# Patient Record
Sex: Female | Born: 1955 | Race: Black or African American | Hispanic: No | Smoking: Never smoker
Health system: Southern US, Community
[De-identification: ages and names within clinical notes are randomized; demographics above are authoritative.]

---

## 2004-11-07 ENCOUNTER — Emergency Department: Payer: Self-pay | Admitting: Emergency Medicine

## 2006-12-03 ENCOUNTER — Emergency Department: Payer: Self-pay | Admitting: Emergency Medicine

## 2008-01-10 ENCOUNTER — Emergency Department: Payer: Self-pay | Admitting: Emergency Medicine

## 2009-09-23 ENCOUNTER — Emergency Department: Payer: Self-pay | Admitting: Internal Medicine

## 2014-12-18 ENCOUNTER — Emergency Department: Payer: Self-pay | Admitting: Emergency Medicine

## 2016-11-05 ENCOUNTER — Emergency Department: Payer: BLUE CROSS/BLUE SHIELD

## 2016-11-05 ENCOUNTER — Emergency Department
Admission: EM | Admit: 2016-11-05 | Discharge: 2016-11-05 | Disposition: A | Discharge status: Home / Self Care | Payer: BLUE CROSS/BLUE SHIELD | Attending: Emergency Medicine | Admitting: Emergency Medicine

## 2016-11-05 ENCOUNTER — Encounter: Payer: Self-pay | Admitting: Emergency Medicine

## 2016-11-05 DIAGNOSIS — R1013 Epigastric pain: Secondary | ICD-10-CM | POA: Diagnosis not present

## 2016-11-05 DIAGNOSIS — R197 Diarrhea, unspecified: Secondary | ICD-10-CM | POA: Insufficient documentation

## 2016-11-05 DIAGNOSIS — R112 Nausea with vomiting, unspecified: Secondary | ICD-10-CM | POA: Insufficient documentation

## 2016-11-05 LAB — COMPREHENSIVE METABOLIC PANEL
ALBUMIN: 4.3 g/dL (ref 3.5–5.0)
ALK PHOS: 75 U/L (ref 38–126)
ALT: 22 U/L (ref 14–54)
AST: 27 U/L (ref 15–41)
Anion gap: 7 (ref 5–15)
BUN: 15 mg/dL (ref 6–20)
CALCIUM: 8.6 mg/dL — AB (ref 8.9–10.3)
CHLORIDE: 106 mmol/L (ref 101–111)
CO2: 24 mmol/L (ref 22–32)
CREATININE: 0.64 mg/dL (ref 0.44–1.00)
GFR calc non Af Amer: >60 mL/min (ref >60)
GLUCOSE: 126 mg/dL — AB (ref 65–99)
Potassium: 3.4 mmol/L — ABNORMAL LOW (ref 3.5–5.1)
SODIUM: 137 mmol/L (ref 135–145)
Total Bilirubin: 0.7 mg/dL (ref 0.3–1.2)
Total Protein: 8.2 g/dL — ABNORMAL HIGH (ref 6.5–8.1)

## 2016-11-05 LAB — URINALYSIS, COMPLETE (UACMP) WITH MICROSCOPIC
Bacteria, UA: NONE SEEN
Bilirubin Urine: NEGATIVE
GLUCOSE, UA: NEGATIVE mg/dL
Hgb urine dipstick: NEGATIVE
KETONES UR: NEGATIVE mg/dL
Leukocytes, UA: NEGATIVE
Nitrite: NEGATIVE
PH: 7 (ref 5.0–8.0)
Protein, ur: NEGATIVE mg/dL
Specific Gravity, Urine: 1.019 (ref 1.005–1.030)

## 2016-11-05 LAB — CBC
HCT: 38.4 % (ref 35.0–47.0)
Hemoglobin: 13.3 g/dL (ref 12.0–16.0)
MCH: 29.5 pg (ref 26.0–34.0)
MCHC: 34.7 g/dL (ref 32.0–36.0)
MCV: 84.8 fL (ref 80.0–100.0)
PLATELETS: 218 10*3/uL (ref 150–440)
RBC: 4.53 MIL/uL (ref 3.80–5.20)
RDW: 12.9 % (ref 11.5–14.5)
WBC: 8.6 10*3/uL (ref 3.6–11.0)

## 2016-11-05 LAB — TROPONIN I: Troponin I: <0.03 ng/mL (ref <0.03)

## 2016-11-05 LAB — LIPASE, BLOOD: LIPASE: 25 U/L (ref 11–51)

## 2016-11-05 MED ORDER — SODIUM CHLORIDE 0.9 % IV BOLUS (SEPSIS)
1000.0000 mL | Freq: Once | INTRAVENOUS | Status: AC
  Administered 2016-11-05: 1000 mL via INTRAVENOUS

## 2016-11-05 MED ORDER — GI COCKTAIL ~~LOC~~
30.0000 mL | Freq: Once | ORAL | Status: AC
  Administered 2016-11-05: 30 mL via ORAL
  Filled 2016-11-05: qty 30

## 2016-11-05 MED ORDER — ONDANSETRON 4 MG PO TBDP
4.0000 mg | ORAL_TABLET | Freq: Once | ORAL | Status: AC | PRN
  Administered 2016-11-05: 4 mg via ORAL
  Filled 2016-11-05: qty 1

## 2016-11-05 MED ORDER — ONDANSETRON HCL 4 MG PO TABS: 4.0000 mg | ORAL_TABLET | Freq: Every day | ORAL | 1 refills | Status: AC | PRN

## 2016-11-05 MED ORDER — OMEPRAZOLE 40 MG PO CPDR: 40.0000 mg | DELAYED_RELEASE_CAPSULE | Freq: Every day | ORAL | 0 refills | Status: AC

## 2016-11-05 MED ORDER — ONDANSETRON HCL 4 MG/2ML IJ SOLN
4.0000 mg | Freq: Once | INTRAMUSCULAR | Status: AC
  Administered 2016-11-05: 4 mg via INTRAVENOUS
  Filled 2016-11-05: qty 2

## 2016-11-05 MED ORDER — IOPAMIDOL (ISOVUE-300) INJECTION 61%
100.0000 mL | Freq: Once | INTRAVENOUS | Status: AC | PRN
  Administered 2016-11-05: 100 mL via INTRAVENOUS

## 2016-11-05 MED ORDER — IOPAMIDOL (ISOVUE-300) INJECTION 61%
30.0000 mL | Freq: Once | INTRAVENOUS | Status: AC
  Administered 2016-11-05: 30 mL via ORAL

## 2016-11-05 NOTE — ED Triage Notes (Signed)
Pt ambulatory to triage with steady gait, no distress noted. Pt c/o N/V/D and upper abdominal pain since 2300 last night. Pt denies fever or chest pain.

## 2016-11-05 NOTE — ED Notes (Signed)
Pt ambulated to the bathroom. Pt returned to her bed without difficulty.

## 2016-11-05 NOTE — ED Provider Notes (Signed)
[facility] Emergency Department Provider Note   First MD Initiated Contact with Patient 11/05/16 [phone]     (approximate)  I have reviewed the triage vital signs and the nursing notes.   HISTORY  Chief Complaint Abdominal Pain    HPI Carmen Cervantes is a 61 y.o. female presents with acute onset of nausea, nonbloody vomiting diarrhea and upper abdominal pain with onset 11:00 last night. Patient denies any fever afebrile on presentation temperature 98.3. Patient describes the pain is burning current pain score 7 out of 10.   Past medical history None There are no active problems to display for this patient.   Past surgical history None  Prior to Admission medications   Medication Sig Start Date End Date Taking? Authorizing Provider  omeprazole (PRILOSEC) 40 MG capsule Take 1 capsule (40 mg total) by mouth daily. 11/05/16 12/05/16  Darci Current, MD  ondansetron (ZOFRAN) 4 MG tablet Take 1 tablet (4 mg total) by mouth daily as needed for nausea or vomiting. 11/05/16 11/05/17  Darci Current, MD    Allergies Patient has no known allergies.  History reviewed. No pertinent family history.  Social History Social History  Substance Use Topics  . Smoking status: Never Smoker  . Smokeless tobacco: Never Used  . Alcohol use Yes    Review of Systems Constitutional: No fever/chills Eyes: No visual changes. ENT: No sore throat. Cardiovascular: Denies chest pain. Respiratory: Denies shortness of breath. Gastrointestinal:Positive for abdominal pain and vomiting and diarrhea  Genitourinary: Negative for dysuria. Musculoskeletal: Negative for back pain. Skin: Negative for rash. Neurological: Negative for headaches, focal weakness or numbness.  10-point ROS otherwise negative.  ____________________________________________   PHYSICAL EXAM:  VITAL SIGNS: ED Triage Vitals [11/05/16 0103]  Enc Vitals Group     BP (!) 149/90     Pulse Rate 88   Resp 15     Temp 98.3 F (36.8 C)     Temp Source Oral     SpO2 100 %     Weight 150 lb (68 kg)     Height 5\' 5"  (1.651 m)     Head Circumference      Peak Flow      Pain Score      Pain Loc      Pain Edu?      Excl. in GC?     Constitutional: Alert and oriented. Well appearing and in no acute distress. Eyes: Conjunctivae are normal. PERRL. EOMI. Head: Atraumatic. Mouth/Throat: Mucous membranes are moist.  Oropharynx non-erythematous. Neck: No stridor.   Cardiovascular: Normal rate, regular rhythm. Good peripheral circulation. Grossly normal heart sounds. Respiratory: Normal respiratory effort.  No retractions. Lungs CTAB. Gastrointestinal: Epigastric/umbilical pain with palpation. No distention.  Musculoskeletal: No lower extremity tenderness nor edema. No gross deformities of extremities. Neurologic:  Normal speech and language. No gross focal neurologic deficits are appreciated.  Skin:  Skin is warm, dry and intact. No rash noted. Psychiatric: Mood and affect are normal. Speech and behavior are normal.  ____________________________________________   LABS (all labs ordered are listed, but only abnormal results are displayed)  Labs Reviewed  COMPREHENSIVE METABOLIC PANEL - Abnormal; Notable for the following:       Result Value   Potassium 3.4 (*)    Glucose, Bld 126 (*)    Calcium 8.6 (*)    Total Protein 8.2 (*)    All other components within normal limits  URINALYSIS, COMPLETE (UACMP) WITH MICROSCOPIC - Abnormal; Notable for the  following:    Color, Urine YELLOW (*)    APPearance CLEAR (*)    Squamous Epithelial / LPF 0-5 (*)    All other components within normal limits  LIPASE, BLOOD  CBC  TROPONIN I   ____________________________________________  EKG  ED ECG REPORT I, Bernice N Aunesty Tyson, the attending physician, personally viewed and interpreted this ECG.   Date: 11/05/2016  EKG Time: 1:08 AM  Rate: 86  Rhythm: Normal sinus rhythm  Axis: Normal   Intervals: Normal  ST&T Change: None _______  RADIOLOGY I, Kress N Almadelia Looman, personally viewed and evaluated these images (plain radiographs) as part of my medical decision making, as well as reviewing the written report by the radiologist.  CLINICAL DATA:  Epigastric and mid abdominal pain.  Vomiting.  EXAM: CT ABDOMEN AND PELVIS WITH CONTRAST  TECHNIQUE: Multidetector CT imaging of the abdomen and pelvis was performed using the standard protocol following bolus administration of intravenous contrast.  CONTRAST:  100mL ISOVUE-300 IOPAMIDOL (ISOVUE-300) INJECTION 61%  COMPARISON:  None.  FINDINGS: Lower chest: Lung bases are clear. No consolidation or pleural fluid. Patulous distal esophagus.  Hepatobiliary: No focal liver abnormality is seen. No gallstones, gallbladder wall thickening, or biliary dilatation.  Pancreas: No ductal dilatation or inflammation.  Spleen: Normal in size without focal abnormality.  Adrenals/Urinary Tract: No adrenal nodule. No hydronephrosis or perinephric edema. Symmetric renal enhancement and excretion on delayed phase imaging. Partially duplicated left renal collecting system. 7 mm cortical hypodensity in the medial mid left kidney is too small to accurately characterize. Urinary bladder is physiologically distended, no bladder wall thickening.  Stomach/Bowel: Stomach is within normal limits. Appendix is faintly visualized and appears normal. No evidence of bowel wall thickening, distention, or inflammatory changes. Enteric contrast is seen throughout the colon.  Vascular/Lymphatic: No significant vascular findings are present. No enlarged abdominal or pelvic lymph nodes.  Reproductive: Uterus and bilateral adnexa are unremarkable. Mild dilatation of the left ovarian vein measuring 8 mm, however no increased adnexal or periuterine vascularity.  Other: Small fat containing umbilical hernia. No free air, free fluid, or  intra-abdominal fluid collection.  Musculoskeletal: There are no acute or suspicious osseous abnormalities. 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  IMPRESSION: 1. No explanation for abdominal pain and vomiting. No acute abnormality. 2. Small fat containing umbilical hernia.   Electronically Signed   By: Rubye OaksMelanie  Ehinger M.D.   On: 11/05/2016 06:37 Procedures     INITIAL IMPRESSION / ASSESSMENT AND PLAN / ED COURSE  Pertinent labs & imaging results that were available during my care of the patient were reviewed by me and considered in my medical decision making (see chart for details).        ____________________________________________  FINAL CLINICAL IMPRESSION(S) / ED DIAGNOSES  Final diagnoses:  Nausea vomiting and diarrhea     MEDICATIONS GIVEN DURING THIS VISIT:  Medications  ondansetron (ZOFRAN-ODT) disintegrating tablet 4 mg (4 mg Oral Given 11/05/16 0109)  ondansetron (ZOFRAN) injection 4 mg (4 mg Intravenous Given 11/05/16 0458)  sodium chloride 0.9 % bolus 1,000 mL (0 mLs Intravenous Stopped 11/05/16 0558)  gi cocktail (Maalox,Lidocaine,Donnatal) (30 mLs Oral Given 11/05/16 0459)  iopamidol (ISOVUE-300) 61 % injection 30 mL (30 mLs Oral Contrast Given 11/05/16 0506)  iopamidol (ISOVUE-300) 61 % injection 100 mL (100 mLs Intravenous Contrast Given 11/05/16 0615)     NEW OUTPATIENT MEDICATIONS STARTED DURING THIS VISIT:  New Prescriptions   OMEPRAZOLE (PRILOSEC) 40 MG CAPSULE    Take 1 capsule (40  mg total) by mouth daily.   ONDANSETRON (ZOFRAN) 4 MG TABLET    Take 1 tablet (4 mg total) by mouth daily as needed for nausea or vomiting.    Modified Medications   No medications on file    Discontinued Medications   No medications on file     Note:  This document was prepared using Dragon voice recognition software and may include unintentional dictation errors.    Darci Current, MD 11/05/16 [phone]

## 2016-11-05 NOTE — ED Notes (Signed)
Pt went to CT

## 2019-09-04 ENCOUNTER — Emergency Department: Payer: BC Managed Care – PPO

## 2019-09-04 ENCOUNTER — Emergency Department
Admission: EM | Admit: 2019-09-04 | Discharge: 2019-09-04 | Disposition: A | Discharge status: Home / Self Care | Payer: BC Managed Care – PPO | Attending: Student | Admitting: Student

## 2019-09-04 ENCOUNTER — Other Ambulatory Visit: Payer: Self-pay

## 2019-09-04 ENCOUNTER — Encounter: Payer: Self-pay | Admitting: Intensive Care

## 2019-09-04 DIAGNOSIS — M25561 Pain in right knee: Secondary | ICD-10-CM

## 2019-09-04 MED ORDER — IBUPROFEN 800 MG PO TABS
800.0000 mg | ORAL_TABLET | Freq: Once | ORAL | Status: AC
  Administered 2019-09-04: 18:00:00 800 mg via ORAL
  Filled 2019-09-04: qty 1

## 2019-09-04 MED ORDER — PREDNISONE 10 MG PO TABS: ORAL_TABLET | ORAL | 0 refills | Status: AC

## 2019-09-04 NOTE — ED Notes (Signed)
Pt given ice pack for right knee.

## 2019-09-04 NOTE — ED Notes (Signed)
Pt to the er for right knee pain that started on Wednesday. Pt denies any hx of arthritis but it does run in the mail. Pain is on the anterior that is distal to the knee cap and runs medial side to the kneecap. Pt has it wrapped.

## 2019-09-04 NOTE — ED Provider Notes (Signed)
[facility] Emergency Department Provider Note ____________________________________________  Time seen: 1750 I have reviewed the triage vital signs and the nursing notes.  HISTORY  Chief Complaint  Knee Pain (right)   HPI Carmen Cervantes is a 63 y.o. female presents to the ER today with complaint of right knee pain.  She reports this started 2 days ago.  She describes the pain as sore and achy.  The pain is worse with ambulation.  She has not noticed any swelling or bruising.  She denies numbness, tingling or weakness of the right lower extremity.  She denies any injury to the area.  She has tried Aleve OTC with some relief.  History reviewed. No pertinent past medical history.  There are no active problems to display for this patient.   History reviewed. No pertinent surgical history.  Prior to Admission medications   Medication Sig Start Date End Date Taking? Authorizing Provider  omeprazole (PRILOSEC) 40 MG capsule Take 1 capsule (40 mg total) by mouth daily. 11/05/16 12/05/16  Darci Current, MD  predniSONE (DELTASONE) 10 MG tablet Take 6 tabs day 1, 5 tabs day 2, 4 tabs day 3, 3 tabs day 4, 2 tabs day 5, 1 tab day 6 09/04/19   Lorre Munroe, NP    Allergies Penicillins  History reviewed. No pertinent family history.  Social History Social History   Tobacco Use  . Smoking status: Never Smoker  . Smokeless tobacco: Never Used  Substance Use Topics  . Alcohol use: Yes  . Drug use: Never    Review of Systems  Constitutional: Negative for fever, chills or body aches. Cardiovascular: Negative for chest tightness or chest pain. Respiratory: Negative for cough or shortness of breath. Musculoskeletal: Positive for right knee pain.  Negative for hip or back pain. Skin: Negative for redness, swelling or warmth. Neurological: Negative for focal weakness, tingling or numbness. ____________________________________________  PHYSICAL EXAM:  VITAL  SIGNS: ED Triage Vitals  Enc Vitals Group     BP 09/04/19 1629 (!) 178/93     Pulse Rate 09/04/19 1629 91     Resp 09/04/19 1629 16     Temp 09/04/19 1624 99.2 F (37.3 C)     Temp Source 09/04/19 1624 Oral     SpO2 09/04/19 1629 97 %     Weight 09/04/19 1624 163 lb (73.9 kg)     Height 09/04/19 1624 5' 5.5" (1.664 m)     Head Circumference --      Peak Flow --      Pain Score 09/04/19 1624 7     Pain Loc --      Pain Edu? --      Excl. in GC? --     Constitutional: Alert and oriented. Well appearing and in no distress. Cardiovascular: Normal rate, regular rhythm.  Pedal pulses 2+ bilaterally Respiratory: Normal respiratory effort. No wheezes/rales/rhonchi. Musculoskeletal: Normal flexion, extension of the right knee.  No joint swelling noted.  Pain with palpation over the medial pes bursa.  Negative Lachman's and McMurray.  Strength 5/5 BLE. Neurologic:  Normal speech and language. No gross focal neurologic deficits are appreciated. Skin:  Skin is warm, dry and intact. No redness, warmth or swelling noted. ____________________________________________   RADIOLOGY   Imaging Orders     DG Knee Complete 4 Views Right IMPRESSION:  Possible trace joint fluid. No acute osseous finding or degenerative  joint narrowing.    ____________________________________________   INITIAL IMPRESSION / ASSESSMENT AND PLAN /  ED COURSE  Acute Right Knee Pain:  Xray right knee- results above Ibuprofen 800 mg PO x 1 Likely pes bursitis RX for Pred Taper x 6 days Ice for 10 minutes 2 x day ____________________________________________  FINAL CLINICAL IMPRESSION(S) / ED DIAGNOSES  Final diagnoses:  Acute pain of right knee   Webb Silversmith, NP     Jearld Fenton, NP 09/04/19 1843    Lilia Pro., MD 09/05/19 1053

## 2019-09-04 NOTE — ED Triage Notes (Signed)
Patient c/o right knee pain. Denies injury ?

## 2019-09-04 NOTE — Discharge Instructions (Signed)
You were seen today for acute right knee pain.  Your x-ray was negative for any acute findings.  You likely have a pes bursitis.  I have provided you with a prescription for prednisone to take for the next 6 days.  Avoid other NSAIDs OTC such as Aleve, Ibuprofen and Motrin. You can apply ice for 10 minutes 2 x day. Follow up if pain persist or worsen.

## 2019-09-07 ENCOUNTER — Other Ambulatory Visit: Payer: Self-pay

## 2019-09-07 DIAGNOSIS — Z20822 Contact with and (suspected) exposure to covid-19: Secondary | ICD-10-CM

## 2019-09-09 LAB — NOVEL CORONAVIRUS, NAA: SARS-CoV-2, NAA: NOT DETECTED

## 2021-08-19 IMAGING — DX DG KNEE COMPLETE 4+V*R*
4 series · 4 of 4 positions shown · non-contrast
Comparison: None.

CLINICAL DATA: Atraumatic right knee pain for 2 days

EXAM:
RIGHT KNEE - COMPLETE 4+ VIEW

[knee obl (1 of 2)]
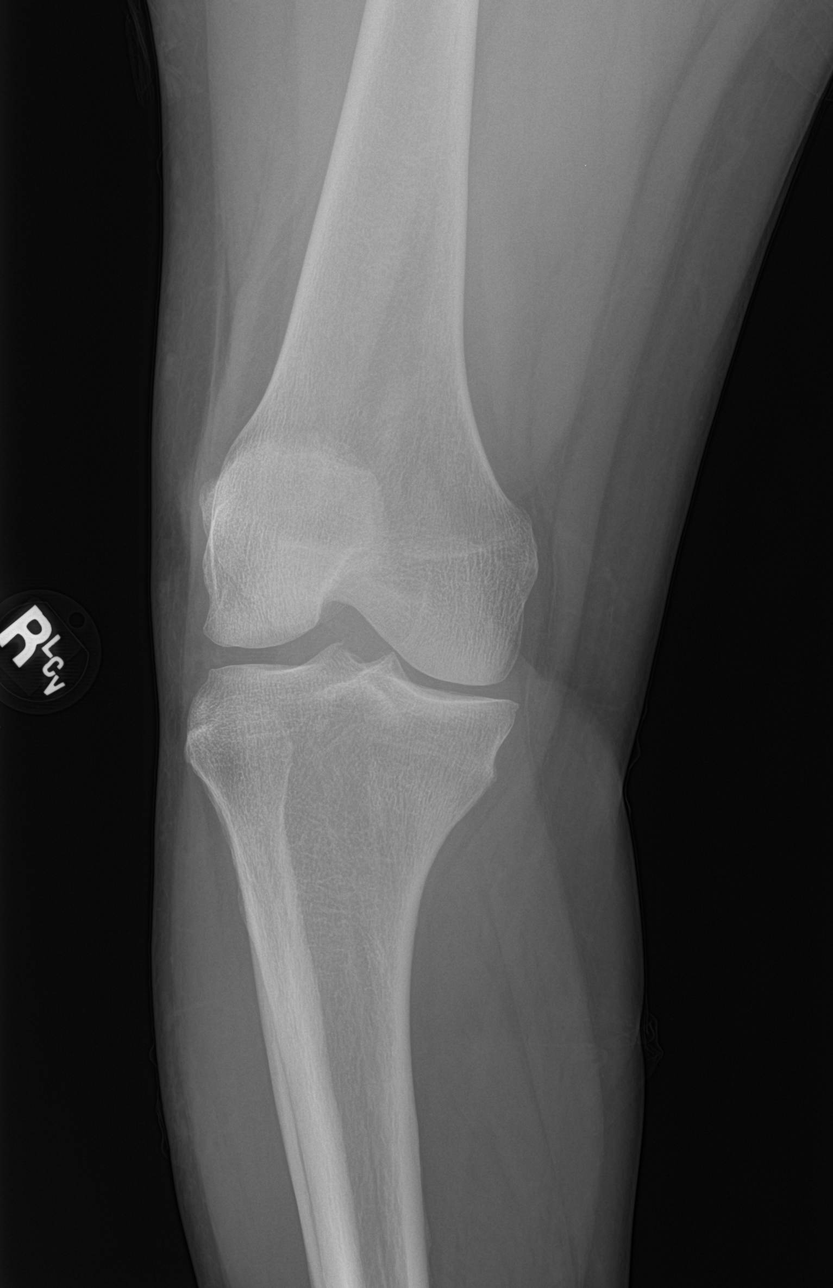

[knee obl (2 of 2)]
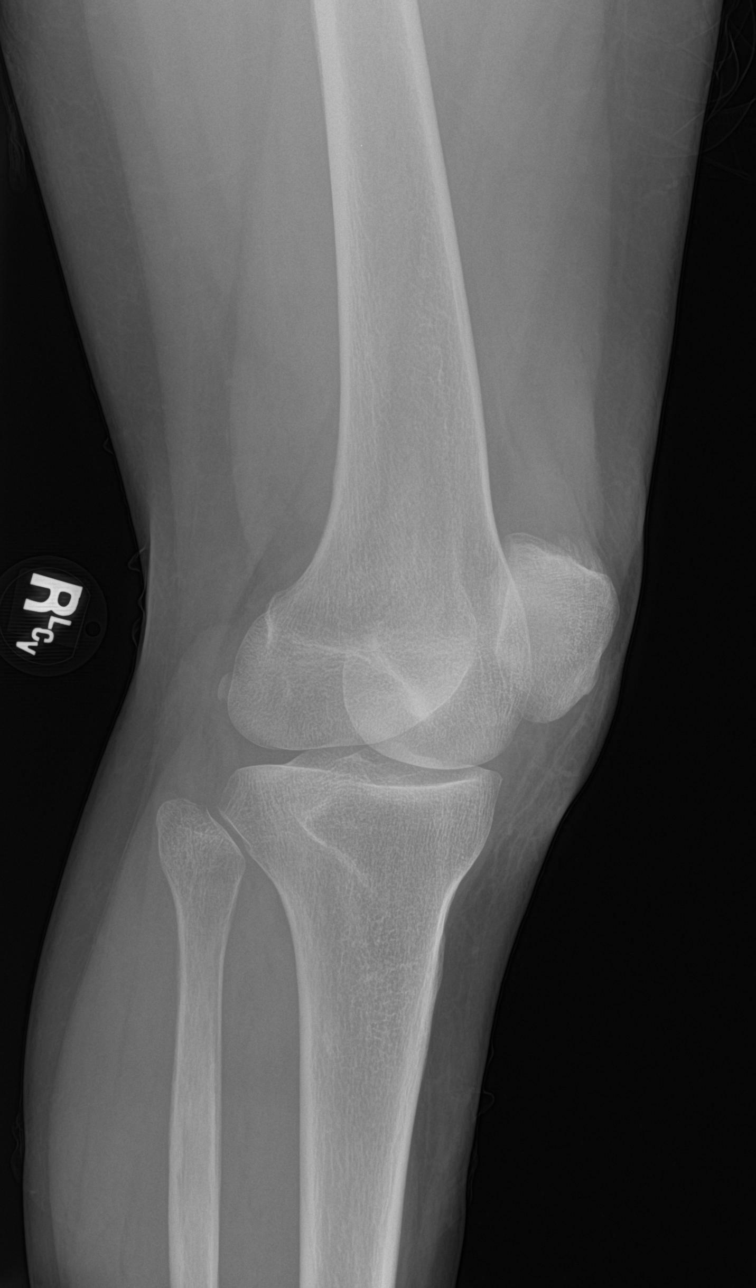

[knee ap]
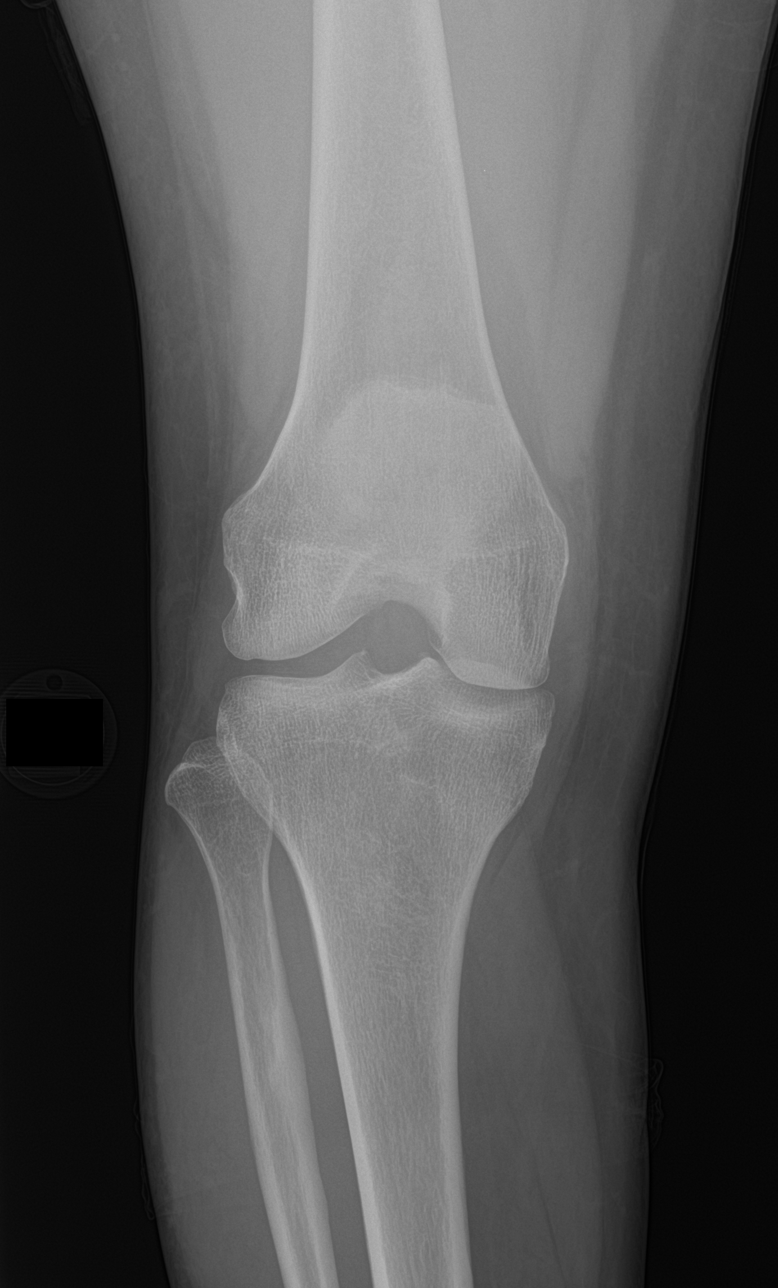

[knee lat]
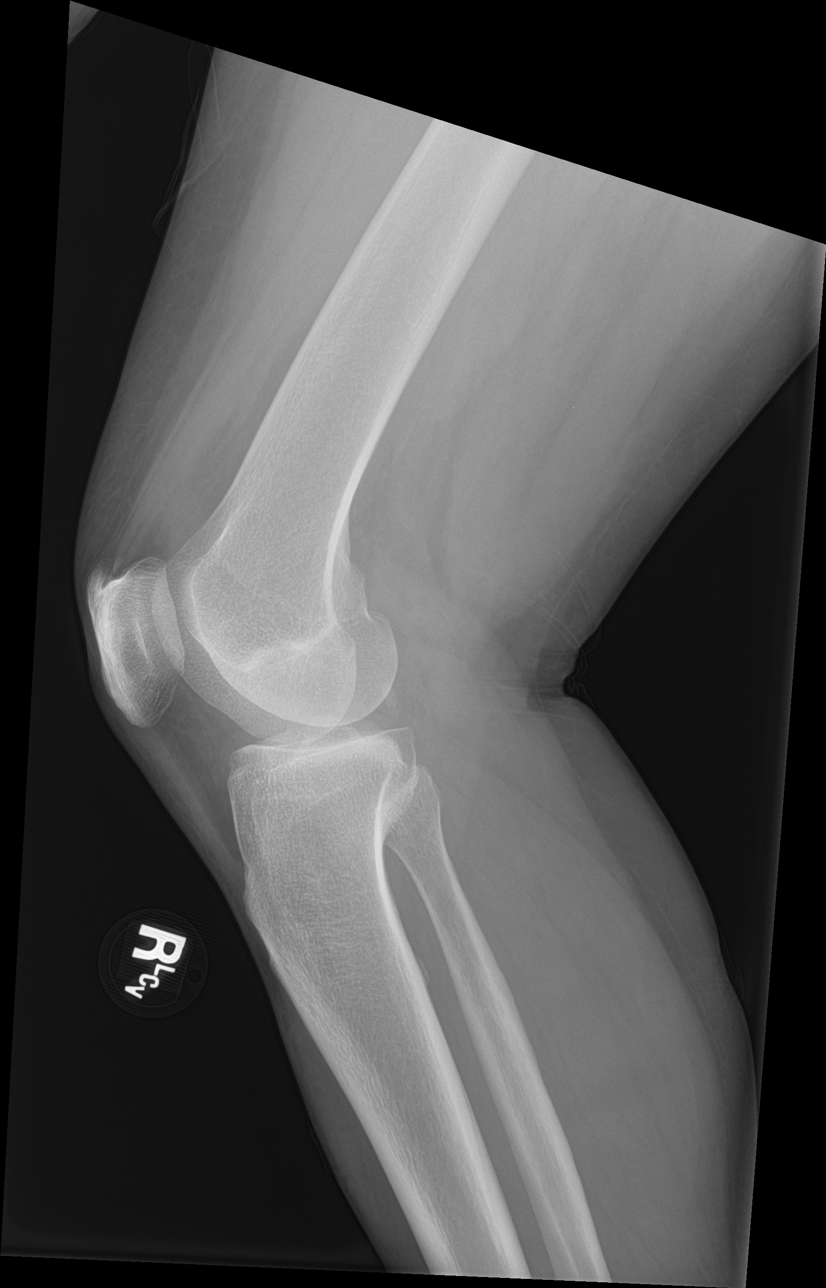

[4 of 4 positions shown; findings below may reference images not displayed]

FINDINGS: Possible trace joint effusion. Suprapatellar enthesophytes without
erosion. No fracture, malalignment, or degenerative marginal
spurring.
IMPRESSION: Possible trace joint fluid. No acute osseous finding or degenerative
joint narrowing.
